# Patient Record
Sex: Male | Born: 1985 | Race: Black or African American | Hispanic: No | Marital: Single | Smoking: Current every day smoker
Health system: Southern US, Community
[De-identification: ages and names within clinical notes are randomized; demographics above are authoritative.]

## PROBLEM LIST (undated history)

## (undated) DIAGNOSIS — I10 Essential (primary) hypertension: Secondary | ICD-10-CM

---

## 2008-07-04 ENCOUNTER — Emergency Department: Payer: Self-pay | Admitting: Emergency Medicine

## 2020-05-06 ENCOUNTER — Emergency Department: Payer: Self-pay

## 2020-05-06 ENCOUNTER — Emergency Department
Admission: EM | Admit: 2020-05-06 | Discharge: 2020-05-06 | Disposition: A | Discharge status: Home / Self Care | Payer: Self-pay | Attending: Emergency Medicine | Admitting: Emergency Medicine

## 2020-05-06 ENCOUNTER — Other Ambulatory Visit: Payer: Self-pay

## 2020-05-06 DIAGNOSIS — F17200 Nicotine dependence, unspecified, uncomplicated: Secondary | ICD-10-CM | POA: Insufficient documentation

## 2020-05-06 DIAGNOSIS — A419 Sepsis, unspecified organism: Secondary | ICD-10-CM

## 2020-05-06 DIAGNOSIS — K122 Cellulitis and abscess of mouth: Secondary | ICD-10-CM

## 2020-05-06 LAB — COMPREHENSIVE METABOLIC PANEL
ALT: 25 U/L (ref 0–44)
AST: 36 U/L (ref 15–41)
Albumin: 4.6 g/dL (ref 3.5–5.0)
Alkaline Phosphatase: 56 U/L (ref 38–126)
Anion gap: 19 — ABNORMAL HIGH (ref 5–15)
BUN: 20 mg/dL (ref 6–20)
CO2: 16 mmol/L — ABNORMAL LOW (ref 22–32)
Calcium: 9.2 mg/dL (ref 8.9–10.3)
Chloride: 100 mmol/L (ref 98–111)
Creatinine, Ser: 1.02 mg/dL (ref 0.61–1.24)
GFR calc Af Amer: >60 mL/min (ref >60)
GFR calc non Af Amer: >60 mL/min (ref >60)
Glucose, Bld: 70 mg/dL (ref 70–99)
Potassium: 5.3 mmol/L — ABNORMAL HIGH (ref 3.5–5.1)
Sodium: 135 mmol/L (ref 135–145)
Total Bilirubin: 0.7 mg/dL (ref 0.3–1.2)
Total Protein: 8.4 g/dL — ABNORMAL HIGH (ref 6.5–8.1)

## 2020-05-06 LAB — CBC WITH DIFFERENTIAL/PLATELET
Abs Immature Granulocytes: 0.06 10*3/uL (ref 0.00–0.07)
Basophils Absolute: 0.1 10*3/uL (ref 0.0–0.1)
Basophils Relative: 1 %
Eosinophils Absolute: 0 10*3/uL (ref 0.0–0.5)
Eosinophils Relative: 0 %
HCT: 48.4 % (ref 39.0–52.0)
Hemoglobin: 17 g/dL (ref 13.0–17.0)
Immature Granulocytes: 0 %
Lymphocytes Relative: 14 %
Lymphs Abs: 2.3 10*3/uL (ref 0.7–4.0)
MCH: 32.9 pg (ref 26.0–34.0)
MCHC: 35.1 g/dL (ref 30.0–36.0)
MCV: 93.8 fL (ref 80.0–100.0)
Monocytes Absolute: 1.2 10*3/uL — ABNORMAL HIGH (ref 0.1–1.0)
Monocytes Relative: 8 %
Neutro Abs: 12.7 10*3/uL — ABNORMAL HIGH (ref 1.7–7.7)
Neutrophils Relative %: 77 %
Platelets: 398 10*3/uL (ref 150–400)
RBC: 5.16 MIL/uL (ref 4.22–5.81)
RDW: 12.9 % (ref 11.5–15.5)
WBC: 16.3 10*3/uL — ABNORMAL HIGH (ref 4.0–10.5)
nRBC: 0 % (ref 0.0–0.2)

## 2020-05-06 LAB — URINALYSIS, COMPLETE (UACMP) WITH MICROSCOPIC
Bacteria, UA: NONE SEEN
Bilirubin Urine: NEGATIVE
Glucose, UA: NEGATIVE mg/dL
Ketones, ur: 20 mg/dL — AB
Leukocytes,Ua: NEGATIVE
Nitrite: NEGATIVE
Protein, ur: 30 mg/dL — AB
Specific Gravity, Urine: 1.035 — ABNORMAL HIGH (ref 1.005–1.030)
Squamous Epithelial / HPF: NONE SEEN (ref 0–5)
pH: 5 (ref 5.0–8.0)

## 2020-05-06 LAB — LACTIC ACID, PLASMA
Lactic Acid, Venous: 7.4 mmol/L (ref 0.5–1.9)
Lactic Acid, Venous: 6.7 mmol/L (ref 0.5–1.9)

## 2020-05-06 LAB — PROTIME-INR
INR: 0.9 (ref 0.8–1.2)
Prothrombin Time: 12.1 seconds (ref 11.4–15.2)

## 2020-05-06 LAB — TROPONIN I (HIGH SENSITIVITY): Troponin I (High Sensitivity): 5 ng/L (ref <18)

## 2020-05-06 MED ORDER — SODIUM CHLORIDE 0.9 % IV SOLN: 1.0000 g | Freq: Once | INTRAVENOUS | Status: DC

## 2020-05-06 MED ORDER — VANCOMYCIN HCL 500 MG/100ML IV SOLN
500.0000 mg | Freq: Once | INTRAVENOUS | Status: AC
  Administered 2020-05-06: 500 mg via INTRAVENOUS
  Filled 2020-05-06: qty 100

## 2020-05-06 MED ORDER — ONDANSETRON HCL 4 MG/2ML IJ SOLN
4.0000 mg | Freq: Once | INTRAMUSCULAR | Status: AC
  Administered 2020-05-06: 4 mg via INTRAVENOUS
  Filled 2020-05-06: qty 2

## 2020-05-06 MED ORDER — MORPHINE SULFATE (PF) 4 MG/ML IV SOLN
4.0000 mg | Freq: Once | INTRAVENOUS | Status: AC
  Administered 2020-05-06: 4 mg via INTRAVENOUS
  Filled 2020-05-06: qty 1

## 2020-05-06 MED ORDER — SODIUM CHLORIDE 0.9 % IV BOLUS (SEPSIS)
250.0000 mL | Freq: Once | INTRAVENOUS | Status: AC
  Administered 2020-05-06: 250 mL via INTRAVENOUS

## 2020-05-06 MED ORDER — VANCOMYCIN HCL IN DEXTROSE 1-5 GM/200ML-% IV SOLN
1000.0000 mg | Freq: Once | INTRAVENOUS | Status: AC
  Administered 2020-05-06: 1000 mg via INTRAVENOUS
  Filled 2020-05-06: qty 200

## 2020-05-06 MED ORDER — SODIUM CHLORIDE 0.9 % IV BOLUS (SEPSIS)
1000.0000 mL | Freq: Once | INTRAVENOUS | Status: AC
  Administered 2020-05-06: 1000 mL via INTRAVENOUS

## 2020-05-06 MED ORDER — IOHEXOL 300 MG/ML  SOLN
75.0000 mL | Freq: Once | INTRAMUSCULAR | Status: AC | PRN
  Administered 2020-05-06: 75 mL via INTRAVENOUS
  Filled 2020-05-06: qty 75

## 2020-05-06 MED ORDER — SODIUM CHLORIDE 0.9 % IV BOLUS: 1000.0000 mL | Freq: Once | INTRAVENOUS | Status: DC

## 2020-05-06 MED ORDER — SODIUM CHLORIDE 0.9 % IV SOLN
2.0000 g | Freq: Once | INTRAVENOUS | Status: AC
  Administered 2020-05-06: 2 g via INTRAVENOUS
  Filled 2020-05-06 (×2): qty 20

## 2020-05-06 NOTE — Progress Notes (Signed)
PHARMACY -  BRIEF ANTIBIOTIC NOTE   Pharmacy has received consult(s) for vancomycin from an ED provider.  The patient's profile has been reviewed for ht/wt/allergies/indication/available labs.    One time order(s) placed for vancomycin 1 g IV x1 by EDP. Will order another 500 mg IV x1 for total loading dose of 1500 mg IV x1.   Further antibiotics/pharmacy consults should be ordered by admitting physician if indicated.                       Thank you, Marty Heck 05/06/2020  4:33 PM

## 2020-05-06 NOTE — ED Triage Notes (Signed)
Reports abscess to roof of mouth X 2 days. Pt alert and oriented X4, cooperative, RR even and unlabored, color WNL. Pt in NAD.

## 2020-05-06 NOTE — ED Notes (Signed)
Waiting on antibiotics from pharmacy - antibiotics not in pixis - pharmacy notified.

## 2020-05-06 NOTE — ED Notes (Signed)
Pt with swollen red area roof of mouth. Pain 10/10

## 2020-05-06 NOTE — ED Provider Notes (Addendum)
[facility] Emergency Department Provider Note  ____________________________________________   First MD Initiated Contact with Patient 05/06/20 1402     (approximate)  I have reviewed the triage vital signs and the nursing notes.   HISTORY  Chief Complaint Abscess    HPI Zachary Pierce is a 34 y.o. male presents emergency department complaining of abscess to the roof of his mouth since Monday.  Pain is 10 out of 10.  States he feels weak and sick due to the abscess.  Some headache.  Patient states that he had his teeth knocked out from accident when he was younger but the old teeth are still there where the implants underneath the replacement teeth.  He denies fever but states he has felt hot and cold.    History reviewed. No pertinent past medical history.  There are no problems to display for this patient.   History reviewed. No pertinent surgical history.  Prior to Admission medications   Not on File    Allergies Patient has no known allergies.  No family history on file.  Social History Social History   Tobacco Use  . Smoking status: Current Every Day Smoker  Substance Use Topics  . Alcohol use: Not Currently  . Drug use: Not on file    Review of Systems  Constitutional: No fever/chills Eyes: No visual changes. ENT: No sore throat.  Positive for abscess or growth in the roof of the mouth Respiratory: Denies cough Cardiovascular: Denies chest pain Gastrointestinal: Denies abdominal pain Genitourinary: Negative for dysuria. Musculoskeletal: Negative for back pain. Skin: Negative for rash. Psychiatric: no mood changes,     ____________________________________________   PHYSICAL EXAM:  VITAL SIGNS: ED Triage Vitals [05/06/20 1300]  Enc Vitals Group     BP (!) 157/78     Pulse Rate 100     Resp 16     Temp 98.9 F (37.2 C)     Temp Source Oral     SpO2 97 %     Weight 153 lb (69.4 kg)     Height 5\' 9"  (1.753 m)       Head Circumference      Peak Flow      Pain Score 10     Pain Loc      Pain Edu?      Excl. in [place]?     Constitutional: Alert and oriented. Well appearing and in no acute distress. Eyes: Conjunctivae are normal.  Head: Atraumatic. Nose: No congestion/rhinnorhea. Mouth/Throat: Mucous membranes are moist.  Positive for marble sized skin//colored mass in the roof of the mouth Neck:  supple no lymphadenopathy noted Cardiovascular: Normal rate, regular rhythm. Heart sounds are normal Respiratory: Normal respiratory effort.  No retractions, lungs c t a  GU: deferred Musculoskeletal: FROM all extremities, warm and well perfused Neurologic:  Normal speech and language.  Skin:  Skin is warm, dry and intact. No rash noted. Psychiatric: Mood and affect are normal. Speech and behavior are normal.  ____________________________________________   LABS (all labs ordered are listed, but only abnormal results are displayed)  Labs Reviewed  CBC WITH DIFFERENTIAL/PLATELET - Abnormal; Notable for the following components:      Result Value   WBC 16.3 (*)    Neutro Abs 12.7 (*)    Monocytes Absolute 1.2 (*)    All other components within normal limits  COMPREHENSIVE METABOLIC PANEL - Abnormal; Notable for the following components:   Potassium 5.3 (*)    CO2 16 (*)  Total Protein 8.4 (*)    Anion gap 19 (*)    All other components within normal limits  LACTIC ACID, PLASMA - Abnormal; Notable for the following components:   Lactic Acid, Venous 7.4 (*)    All other components within normal limits  LACTIC ACID, PLASMA - Abnormal; Notable for the following components:   Lactic Acid, Venous 6.7 (*)    All other components within normal limits  URINALYSIS, COMPLETE (UACMP) WITH MICROSCOPIC - Abnormal; Notable for the following components:   Color, Urine STRAW (*)    APPearance CLEAR (*)    Specific Gravity, Urine 1.035 (*)    Hgb urine dipstick MODERATE (*)    Ketones, ur 20 (*)     Protein, ur 30 (*)    All other components within normal limits  CULTURE, BLOOD (ROUTINE X 2)  CULTURE, BLOOD (ROUTINE X 2)  URINE CULTURE  PROTIME-INR  TROPONIN I (HIGH SENSITIVITY)   ____________________________________________   ____________________________________________  RADIOLOGY  CT maxillofacial with IV contrast shows a large cyst at the hard palate  ____________________________________________   PROCEDURES  Procedure(s) performed: EKG shows normal sinus rhythm   Procedures   CRITICAL CARE Performed by: Faythe Ghee   Total critical care time: 45 minutes  Critical care time was exclusive of separately billable procedures and treating other patients.  Critical care was necessary to treat or prevent imminent or life-threatening deterioration.  Multiple conversations with the patient.  Multiple antibiotics ordered.  Several visits into the room to assess his status.  Patient still left AMA  Critical care was time spent personally by me on the following activities: development of treatment plan with patient and/or surrogate as well as nursing, discussions with consultants, evaluation of patient's response to treatment, examination of patient, obtaining history from patient or surrogate, ordering and performing treatments and interventions, ordering and review of laboratory studies, ordering and review of radiographic studies, pulse oximetry and re-evaluation of patient's condition.  ____________________________________________   INITIAL IMPRESSION / ASSESSMENT AND PLAN / ED COURSE  Pertinent labs & imaging results that were available during my care of the patient were reviewed by me and considered in my medical decision making (see chart for details).   Patient is a 34 year old male presents emergency department with concerns of an abscess in the roof of the mouth.  See HPI physical exam does show that there is a marble sized lesion on the roof of the  mouth.  DDx: Dental abscess, tumor, sinus abscess  CBC, metabolic panel, lactic acid  CT maxillofacial with IV contrast  ----------------------------------------- 3:41 PM on 05/06/2020 -----------------------------------------  Patient's WBC is elevated at 16.5, now his lactic acid is showing he may be septic as it is elevated at 7.4.  Patient did not originally look septic upon arrival.  We will now be considering him septic.  Code sepsis was initiated by Dr. Fuller Plan    CBC is elevated WBC of 16.3 with elevated neutrophils of 13.7, comprehensive metabolic panel has a increased potassium of 5.3, INR is normal, lactic acid was 7.3 on the first draw, 2 hours later was 6.7  Long discussion with the patient about being admitted for sepsis.  Patient is still refusing and will sign out AMA.  Dr. Fuller Plan in to see the patient and also explained to him that he needs to be admitted.  Patient is still refusing.  Had his fiance called the patient's mother for her to talk to him and he is still refusing to stay.  I did speak with Dr. Jenne Campus about the cyst.  He states this is more of a dental abscess that needs to be seen and treated by oral surgeon.  States they do have an Transport planner on-call at [facility].  EKG did show normal sinus rhythm.  Troponin to evaluate heart function in case there could be a chance of endocarditis, pericarditis or myocarditis.  ----------------------------------------- 7:08 PM on 05/06/2020 -----------------------------------------  EKG and troponin are reassuring.  They are both normal.  I did explain the findings to the patient.  He is still refusing admission.  Explained to him he will sign out AMA which means he does not get antibiotics to go home with or pain medication.  He states he understands.  I did go through the discussion of sepsis and kill you.  I explained to him that he may get so sick that we are not able to save him.  His fiance is very upset with him for  not staying.  But he is still refusing admission.  Nurses will sign him out AMA.   Zachary Pierce was evaluated in Emergency Department on 05/06/2020 for the symptoms described in the history of present illness. He was evaluated in the context of the global COVID-19 pandemic, which necessitated consideration that the patient might be at risk for infection with the SARS-CoV-2 virus that causes COVID-19. Institutional protocols and algorithms that pertain to the evaluation of patients at risk for COVID-19 are in a state of rapid change based on information released by regulatory bodies including the CDC and federal and state organizations. These policies and algorithms were followed during the patient's care in the ED.   As part of my medical decision making, I reviewed the following data within the electronic MEDICAL RECORD NUMBER Nursing notes reviewed and incorporated, Labs reviewed , EKG interpreted NSR, Old EKG reviewed, Radiograph reviewed , Evaluated by EM attending dr Fuller Plan, consult from ENT, dr Jenne Campus, Notes from prior ED visits and [place] Controlled Substance Database  ____________________________________________   FINAL CLINICAL IMPRESSION(S) / ED DIAGNOSES  Final diagnoses:  Acute sepsis (HCC)  Abscess of palate      NEW MEDICATIONS STARTED DURING THIS VISIT:  New Prescriptions   No medications on file     Note:  This document was prepared using Dragon voice recognition software and may include unintentional dictation errors.    Faythe Ghee, PA-C 05/06/20 1910    Sherrie Mustache Roselyn Bering, PA-C 05/09/20 1044    Concha Se, MD 05/11/20 [phone]

## 2020-05-06 NOTE — ED Notes (Signed)
Pt stating he wants to leave. This RN , Social worker and [place], [place] encouraged pt to stay to be admitted and treat infection. Pt agreeing to receive antibiotics and fluids but states will leave once finished. Pt made aware by nurses and PA the dangers of leaving AMA. Pt verbalizes understanding.

## 2020-05-06 NOTE — Progress Notes (Signed)
CODE SEPSIS - PHARMACY COMMUNICATION  **Broad Spectrum Antibiotics should be administered within 1 hour of Sepsis diagnosis**  Time Code Sepsis Called/Page Received: 1546  Antibiotics Ordered: ceftriaxone and vancomycin   Time of 1st antibiotic administration: 1658 - ceftriaxone   Additional action taken by pharmacy: Called RN at 1635 - delay in abx as pt may not stay to be treated  If necessary, Name of Provider/Nurse Contacted:     Marty Heck ,PharmD Clinical Pharmacist  05/06/2020  4:34 PM

## 2020-05-06 NOTE — ED Notes (Signed)
Pt in CT.

## 2020-05-06 NOTE — ED Notes (Signed)
Date and time results received: 05/06/20 3:40 PM  (use smartphrase ".now" to insert current time)  Test: Lactic  Critical Value: 7.4  Name of Provider Notified: Darl Pikes, [place] and Dr. Fuller Plan  Orders Received? Or Actions Taken?: No new orders at this time

## 2020-05-08 LAB — URINE CULTURE: Culture: NO GROWTH

## 2020-05-11 LAB — CULTURE, BLOOD (ROUTINE X 2)
Culture: NO GROWTH
Culture: NO GROWTH
Special Requests: ADEQUATE

## 2022-01-16 ENCOUNTER — Ambulatory Visit: Admission: EM | Admit: 2022-01-16 | Discharge: 2022-01-16 | Payer: Self-pay

## 2022-01-16 ENCOUNTER — Emergency Department: Payer: Self-pay

## 2022-01-16 ENCOUNTER — Other Ambulatory Visit: Payer: Self-pay

## 2022-01-16 ENCOUNTER — Emergency Department
Admission: EM | Admit: 2022-01-16 | Discharge: 2022-01-16 | Disposition: A | Discharge status: Nursing Home (Includes ICF) | Payer: Self-pay | Attending: Emergency Medicine | Admitting: Emergency Medicine

## 2022-01-16 DIAGNOSIS — R579 Shock, unspecified: Secondary | ICD-10-CM

## 2022-01-16 DIAGNOSIS — A419 Sepsis, unspecified organism: Secondary | ICD-10-CM | POA: Insufficient documentation

## 2022-01-16 DIAGNOSIS — M726 Necrotizing fasciitis: Secondary | ICD-10-CM | POA: Insufficient documentation

## 2022-01-16 DIAGNOSIS — Z20822 Contact with and (suspected) exposure to covid-19: Secondary | ICD-10-CM | POA: Insufficient documentation

## 2022-01-16 DIAGNOSIS — N179 Acute kidney failure, unspecified: Secondary | ICD-10-CM | POA: Insufficient documentation

## 2022-01-16 DIAGNOSIS — R6521 Severe sepsis with septic shock: Secondary | ICD-10-CM | POA: Insufficient documentation

## 2022-01-16 LAB — RESP PANEL BY RT-PCR (FLU A&B, COVID) ARPGX2
Influenza A by PCR: NEGATIVE
Influenza B by PCR: NEGATIVE
SARS Coronavirus 2 by RT PCR: NEGATIVE

## 2022-01-16 LAB — CBC
HCT: 51.8 % (ref 39.0–52.0)
Hemoglobin: 17.4 g/dL — ABNORMAL HIGH (ref 13.0–17.0)
MCH: 32.8 pg (ref 26.0–34.0)
MCHC: 33.6 g/dL (ref 30.0–36.0)
MCV: 97.7 fL (ref 80.0–100.0)
Platelets: 278 10*3/uL (ref 150–400)
RBC: 5.3 MIL/uL (ref 4.22–5.81)
RDW: 12.7 % (ref 11.5–15.5)
WBC: 27.3 10*3/uL — ABNORMAL HIGH (ref 4.0–10.5)
nRBC: 0 % (ref 0.0–0.2)

## 2022-01-16 LAB — BASIC METABOLIC PANEL
Anion gap: 17 — ABNORMAL HIGH (ref 5–15)
BUN: 57 mg/dL — ABNORMAL HIGH (ref 6–20)
CO2: 16 mmol/L — ABNORMAL LOW (ref 22–32)
Calcium: 7.2 mg/dL — ABNORMAL LOW (ref 8.9–10.3)
Chloride: 101 mmol/L (ref 98–111)
Creatinine, Ser: 3.58 mg/dL — ABNORMAL HIGH (ref 0.61–1.24)
GFR, Estimated: 22 mL/min — ABNORMAL LOW (ref >60)
Glucose, Bld: 132 mg/dL — ABNORMAL HIGH (ref 70–99)
Potassium: 4.7 mmol/L (ref 3.5–5.1)
Sodium: 134 mmol/L — ABNORMAL LOW (ref 135–145)

## 2022-01-16 LAB — APTT: aPTT: 38 seconds — ABNORMAL HIGH (ref 24–36)

## 2022-01-16 LAB — LACTIC ACID, PLASMA
Lactic Acid, Venous: 3.9 mmol/L (ref 0.5–1.9)
Lactic Acid, Venous: 3.8 mmol/L (ref 0.5–1.9)

## 2022-01-16 LAB — PROTIME-INR
INR: 2 — ABNORMAL HIGH (ref 0.8–1.2)
Prothrombin Time: 22.5 seconds — ABNORMAL HIGH (ref 11.4–15.2)

## 2022-01-16 LAB — MAGNESIUM: Magnesium: 1.7 mg/dL (ref 1.7–2.4)

## 2022-01-16 MED ORDER — CLINDAMYCIN PHOSPHATE 900 MG/50ML IV SOLN
900.0000 mg | Freq: Once | INTRAVENOUS | Status: AC
  Administered 2022-01-16: 900 mg via INTRAVENOUS
  Filled 2022-01-16: qty 50

## 2022-01-16 MED ORDER — MORPHINE SULFATE (PF) 4 MG/ML IV SOLN
6.0000 mg | Freq: Once | INTRAVENOUS | Status: AC
Start: 2022-01-16 — End: 2022-01-16
  Administered 2022-01-16: 6 mg via INTRAVENOUS
  Filled 2022-01-16: qty 2

## 2022-01-16 MED ORDER — VANCOMYCIN HCL 1750 MG/350ML IV SOLN
1750.0000 mg | Freq: Once | INTRAVENOUS | Status: AC
  Administered 2022-01-16: 1750 mg via INTRAVENOUS
  Filled 2022-01-16 (×2): qty 350

## 2022-01-16 MED ORDER — LACTATED RINGERS IV BOLUS
30.0000 mL/kg | Freq: Once | INTRAVENOUS | Status: AC
Start: 2022-01-16 — End: 2022-01-16
  Administered 2022-01-16: 2124 mL via INTRAVENOUS

## 2022-01-16 MED ORDER — LACTATED RINGERS IV SOLN: INTRAVENOUS | Status: DC

## 2022-01-16 MED ORDER — PIPERACILLIN-TAZOBACTAM 3.375 G IVPB 30 MIN
3.3750 g | Freq: Once | INTRAVENOUS | Status: AC
Start: 2022-01-16 — End: 2022-01-16
  Administered 2022-01-16: 3.375 g via INTRAVENOUS
  Filled 2022-01-16: qty 50

## 2022-01-16 MED ORDER — CALCIUM GLUCONATE-NACL 1-0.675 GM/50ML-% IV SOLN
1.0000 g | Freq: Once | INTRAVENOUS | Status: AC
Start: 2022-01-16 — End: 2022-01-16
  Administered 2022-01-16: 1000 mg via INTRAVENOUS
  Filled 2022-01-16: qty 50

## 2022-01-16 NOTE — ED Notes (Addendum)
Called [facility] transfer  Tresa Endo) for ED to ED transfer for Fuller Plan, MD

## 2022-01-16 NOTE — Consult Note (Signed)
PHARMACY -  BRIEF ANTIBIOTIC NOTE   Pharmacy has received consult(s) for cellulitis from an ED provider.  The patient's profile has been reviewed for ht/wt/allergies/indication/available labs.    One time order(s) placed for vancomycin   Further antibiotics/pharmacy consults should be ordered by admitting physician if indicated.                       Thank you, Ronnald Ramp 01/16/2022  7:55 PM

## 2022-01-16 NOTE — ED Notes (Signed)
Patient is being discharged from the Urgent Care and sent to the Emergency Department via EMS . Per White, NP, patient is in need of higher level of care due to difficulty breathing. Patient is aware and verbalizes understanding of plan of care.  Vitals:   01/16/22 1719  BP: (!) 49/27  Pulse: (!) 134  Resp: 16  SpO2: 100%

## 2022-01-16 NOTE — Sepsis Progress Note (Signed)
Elink following Code Sepsis. 

## 2022-01-16 NOTE — ED Triage Notes (Signed)
Pt to ED from UC for right sided facial swelling. Stated cracked left lower tooth 2 days ago. Noticed swelling today. Significant swelling noted. Pt unable to open left eye. Unable to open mouth all the way.

## 2022-01-16 NOTE — ED Notes (Signed)
Patient transported to CT 

## 2022-01-16 NOTE — ED Notes (Signed)
ED Provider at bedside. 

## 2022-01-16 NOTE — ED Provider Notes (Signed)
[facility] Provider Note    Event Date/Time   First MD Initiated Contact with Patient 01/16/22 1940     (approximate)   History   Facial Swelling   HPI  Zachary Pierce is a 36 y.o. male without significant past medical history who presents for assessment of 4 days of worsening pain redness and swelling on the left side of his face that he believes is emanating from the cracked left inferior posterior molar.  He states he cracked it when his symptoms began while eating some chicken.  He denies any other trauma.  He has been spitting some blood and pus since then.  He denies any chest pain, cough, shortness of breath, back pain, abdominal pain, diarrhea, constipation, urinary symptoms rash or extremity pain.  He endorses some THC use but no other illicit drug use, tobacco abuse or EtOH use.  States that something similar happened years ago when he had an infected tooth on the other side but it was not this bad.  He was in urgent care earlier today who referred immediately to emergency room.      Physical Exam  Triage Vital Signs: ED Triage Vitals  Enc Vitals Group     BP 01/16/22 1849 (!) 144/96     Pulse Rate 01/16/22 1858 (!) 125     Resp 01/16/22 1849 20     Temp 01/16/22 1849 98.9 F (37.2 C)     Temp Source 01/16/22 1849 Oral     SpO2 01/16/22 1858 100 %     Weight 01/16/22 1852 156 lb (70.8 kg)     Height 01/16/22 1852 5\' 9"  (1.753 m)     Head Circumference --      Peak Flow --      Pain Score 01/16/22 1852 8     Pain Loc --      Pain Edu? --      Excl. in [place]? --     Most recent vital signs: Vitals:   01/16/22 1858 01/16/22 1940  BP:  (!) 163/127  Pulse: (!) 125 (!) 116  Resp:  20  Temp:    SpO2: 100% 96%    General: Awake, ill-appearing CV:  Tachycardic.  Prolonged capillary refill.  No murmurs rubs or gallops Resp:  Normal effort.  Clear bilaterally Abd:  No distention.  Soft Other:  Diffuse left-sided facial swelling  including periorbital edema and over the left maxilla and left jaw.  Somewhat difficult to clearly visualize patient's left posterior inferior molar and gingiva although there is area of edema and some purulent drainage in this area.  He is actively spitting up some purulent saliva mixed with some blood.  There is no stridor over his neck and right face and right are grossly unremarkable.   ED Results / Procedures / Treatments  Labs (all labs ordered are listed, but only abnormal results are displayed) Labs Reviewed  CBC - Abnormal; Notable for the following components:      Result Value   WBC 27.3 (*)    Hemoglobin 17.4 (*)    All other components within normal limits  BASIC METABOLIC PANEL - Abnormal; Notable for the following components:   Sodium 134 (*)    CO2 16 (*)    Glucose, Bld 132 (*)    BUN 57 (*)    Creatinine, Ser 3.58 (*)    Calcium 7.2 (*)    GFR, Estimated 22 (*)    Anion gap 17 (*)  All other components within normal limits  LACTIC ACID, PLASMA - Abnormal; Notable for the following components:   Lactic Acid, Venous 3.9 (*)    All other components within normal limits  CULTURE, BLOOD (ROUTINE X 2)  CULTURE, BLOOD (ROUTINE X 2)  RESP PANEL BY RT-PCR (FLU A&B, COVID) ARPGX2  LACTIC ACID, PLASMA  PROTIME-INR  APTT  MAGNESIUM     EKG    RADIOLOGY  CT face ordered interpreted by myself shows extensive soft tissue swelling and gas in the left maxilla area tracking towards the left orbit apparently into the orbital space.  Also reviewed radiology interpretation and agree with their findings.  PROCEDURES:  Critical Care performed: Yes, see critical care procedure note(s)  .Critical Care Performed by: Lucrezia Starch, MD Authorized by: Lucrezia Starch, MD   Critical care provider statement:    Critical care time (minutes):  30   Critical care was necessary to treat or prevent imminent or life-threatening deterioration of the following conditions:   Sepsis   Critical care was time spent personally by me on the following activities:  Development of treatment plan with patient or surrogate, discussions with consultants, evaluation of patient's response to treatment, examination of patient, ordering and review of laboratory studies, ordering and review of radiographic studies, ordering and performing treatments and interventions, pulse oximetry, re-evaluation of patient's condition and review of old charts  The patient is on the cardiac monitor to evaluate for evidence of arrhythmia and/or significant heart rate changes.   MEDICATIONS ORDERED IN ED: Medications  vancomycin (VANCOREADY) IVPB 1750 mg/350 mL (has no administration in time range)  lactated ringers infusion (has no administration in time range)  calcium gluconate 1 g/ 50 mL sodium chloride IVPB (has no administration in time range)  lactated ringers bolus 2,124 mL (2,124 mLs Intravenous New Bag/Given 01/16/22 2033)  piperacillin-tazobactam (ZOSYN) IVPB 3.375 g (3.375 g Intravenous New Bag/Given 01/16/22 2031)  morphine 4 MG/ML injection 6 mg (6 mg Intravenous Given 01/16/22 2034)  clindamycin (CLEOCIN) IVPB 900 mg (900 mg Intravenous New Bag/Given 01/16/22 2049)     IMPRESSION / MDM / ASSESSMENT AND PLAN / ED COURSE  I reviewed the triage vital signs and the nursing notes.                              Differential diagnosis includes, but is not limited to large facial abscess possibly odontogenic in origin causing sepsis and metabolic derangements.  I think this is likely the source of patient's purulent and bloody saliva.  He reports cracking a tooth and so it could also have been traumatic initially.  He has no neck stiffness or other pain that is not localized to the left side of his face and I do not believe he is meningitic.  He is currently protecting his airway.  He is tachycardic and hypertensive on arrival but otherwise has no evidence of hypoxia or respiratory  distress.   CT face ordered interpreted by myself shows extensive soft tissue swelling and gas in the left maxilla area tracking towards the left orbit apparently into the orbital space.  Also reviewed radiology interpretation and agree with their findings.  CBC is remarkable for WBC count of 27.3 hemoglobin of 17.4.  BMP is remarkable for bicarb of 16, BUN 57, creatinine of 3.58 with anion gap of 17.  Initial lactic acid of 6.7.  This down trended to 3.9 after some IV fluids.  Full sepsis work-up initiated including blood cultures and broad-spectrum antibiotics started.  Given concern for necrotizing infection requiring emergent multispecialty intervention Dr. Nickolas Madrid my colleague in the emergency room assist in reaching out to [facility] for emergent ED to ED transfer.  Patient accepted by Dr. Victorio Palm.       FINAL CLINICAL IMPRESSION(S) / ED DIAGNOSES   Final diagnoses:  Necrotizing fasciitis ([place])  AKI (acute kidney injury) ([place])  Sepsis, due to unspecified organism, unspecified whether acute organ dysfunction present ([place])  Shock ([place])  Hypocalcemia     Rx / DC Orders   ED Discharge Orders     None        Note:  This document was prepared using Dragon voice recognition software and may include unintentional dictation errors.   Lucrezia Starch, MD 01/16/22 2124

## 2022-01-16 NOTE — ED Notes (Signed)
Med nescessity completed and given to [facility]

## 2022-01-16 NOTE — ED Triage Notes (Signed)
Patient presents to Urgent Care with complaints of left lower dental abscess. Mom states facial swelling increased today. Pt states earlier he was having difficulty swallowing.

## 2022-01-16 NOTE — ED Notes (Signed)
Provider at bedside

## 2022-01-16 NOTE — ED Notes (Signed)
This RN spoke to Proofreader from [place]. Report given to pt. All questions answered

## 2022-01-16 NOTE — ED Notes (Signed)
Vancomycin ordered from pharamcy

## 2022-01-16 NOTE — ED Provider Notes (Addendum)
8:50 PM Assumed care for off going team.   Blood pressure (!) 163/127, pulse (!) 116, temperature 98.9 F (37.2 C), temperature source Oral, resp. rate 20, height 5\' 9"  (1.753 m), weight 70.8 kg, SpO2 96 %.  See their HPI for full report but in brief I received the report that patient had a CT scan concerning for necrotizing fasciitis extending into the middle cranial fossa.  Due to the extent of this needs to be transferred.  Discussed with family about options and they are okay with going to do.  Patient is a new renal failure with tachycardia and significantly elevated white count.  I did discuss the case with ENT who recommends transfer to main hospital  I discussed with the [facility] team.  I added on clindamycin.  I explained to family my concerns for significant infection in his face  Pt accepted Dr. to [facility]   .Critical Care Performed by: [facility], MD Authorized by: Concha Se, MD   Critical care provider statement:    Critical care time (minutes):  30   Critical care was time spent personally by me on the following activities:  Development of treatment plan with patient or surrogate, discussions with consultants, evaluation of patient's response to treatment, examination of patient, ordering and review of laboratory studies, ordering and review of radiographic studies, ordering and performing treatments and interventions, pulse oximetry, re-evaluation of patient's condition and review of old charts          Concha Se, MD 01/16/22 2122

## 2022-01-16 NOTE — ED Notes (Addendum)
[facility] at bedside to transfer pt to [facility]. Report given to [employer] Pt stable at this time. Morrie Sheldon RN from [place] called and notified that pt will be on his way shortly

## 2022-01-16 NOTE — ED Notes (Signed)
First nurse-pt brought in via ems from [facility] urgent care with swelling to left side of face and abscess tooth.2 iv's in place.  Bp186/123, pulse107, resp 26, temp 98.4 orally per ems.  Pt in wheelchair in lobby  pt alert  iv fluids infusing.  Family with pt.

## 2022-01-16 NOTE — Sepsis Progress Note (Signed)
Notified bedside nurse of need to draw repeat lactic acid. 

## 2022-01-16 NOTE — Consult Note (Signed)
CODE SEPSIS - PHARMACY COMMUNICATION  **Broad Spectrum Antibiotics should be administered within 1 hour of Sepsis diagnosis**  Time Code Sepsis Called/Page Received: 1957  Antibiotics Ordered: 2000  Time of 1st antibiotic administration: 2031  Additional action taken by pharmacy: N/A  If necessary, Name of Provider/Nurse Contacted: N/A  Martyn Malay ,PharmD Clinical Pharmacist  01/16/2022  9:24 PM

## 2022-01-21 LAB — CULTURE, BLOOD (ROUTINE X 2): Culture: NO GROWTH

## 2023-01-02 ENCOUNTER — Encounter: Payer: Self-pay | Admitting: Emergency Medicine

## 2023-01-02 ENCOUNTER — Ambulatory Visit: Admission: EM | Admit: 2023-01-02 | Discharge: 2023-01-02 | Payer: Self-pay

## 2023-01-02 DIAGNOSIS — I10 Essential (primary) hypertension: Secondary | ICD-10-CM

## 2023-01-02 HISTORY — DX: Essential (primary) hypertension: I10

## 2023-01-02 NOTE — Discharge Instructions (Signed)
As we discussed, your elevated blood pressure, fast heart rate, and EKG changes are definitely concerning.  Additionally, you have pain, redness, and swelling in your mouth with a history of necrotizing fasciitis.  For these reasons I feel you would better be served to be evaluated in the emergency department.  Please go to [facility] for further evaluation.  Please go now.

## 2023-01-02 NOTE — ED Triage Notes (Addendum)
Pt states he woke up with chills and took his BP it was 213/177 on his wrist. He took it again 2 hours later and it was 203/120's. Pt is on BP medication but has not taken it for 3 months due to being out of the medication. His last PCP visit was in October 2023 at [facility].   Pt also reports his BP was high last week when he went to the dentist to get his bridge fixed.

## 2023-01-02 NOTE — ED Provider Notes (Signed)
[facility]    CSN: [id number] Arrival date & time: 01/02/23  1104      History   Chief Complaint Chief Complaint  Patient presents with   Hypertension    HPI Zachary Pierce is a 37 y.o. male.   HPI  37 year old male here for evaluation of elevated blood pressure.  The patient has a history of hypertension and is prescribed amlodipine 10 mg daily and Coreg 25 mg daily but states that he has not taken them in the last 3 months because he could not afford them.  He is followed by [facility] and goes to the [facility] in [place].  He, he has not been taking the 5 mg Eliquis that he has been prescribed for the last 3 months either.  He was prescribed this by [facility] a year ago after having necrotizing fasciitis and multiple incisions and drainage of sinus and oropharyngeal infection.  He states that he went to his dentist last week to have his bridge repaired and he had elevated blood pressure at that time as well.  He is complaining of cold chills, shortness of breath, pain and swelling in his mouth.  Past Medical History:  Diagnosis Date   Hypertension     There are no problems to display for this patient.   History reviewed. No pertinent surgical history.     Home Medications    Prior to Admission medications   Medication Sig Start Date End Date Taking? Authorizing Provider  amLODipine (NORVASC) 10 MG tablet Take by mouth. 02/23/22  Yes [provider]  apixaban (ELIQUIS) 5 MG TABS tablet Take by mouth. 02/22/22  Yes [provider]  carvedilol (COREG) 25 MG tablet Take by mouth. 02/22/22  Yes [provider]    Family History History reviewed. No pertinent family history.  Social History Social History   Tobacco Use   Smoking status: Every Day    Types: Cigars   Smokeless tobacco: Never  Vaping Use   Vaping Use: Never used  Substance Use Topics   Alcohol use: Yes   Drug use: Yes    Types:  Marijuana     Allergies   Patient has no known allergies.   Review of Systems Review of Systems  Constitutional:  Positive for chills. Negative for diaphoresis and fever.  HENT:  Positive for dental problem and mouth sores.        Multiple broken front teeth with pain and swelling of the surrounding gum tissue.  Respiratory:  Positive for shortness of breath.   Cardiovascular:  Negative for chest pain.  Neurological:  Negative for dizziness and headaches.  Hematological: Negative.   Psychiatric/Behavioral: Negative.       Physical Exam Triage Vital Signs ED Triage Vitals  Enc Vitals Group     BP      Pulse      Resp      Temp      Temp src      SpO2      Weight      Height      Head Circumference      Peak Flow      Pain Score      Pain Loc      Pain Edu?      Excl. in [place]?    No data found.  Updated Vital Signs BP (!) 178/109 (BP Location: Left Arm)   Pulse (!) 102   Temp 98.4 F (36.9  C) (Oral)   Resp 18   SpO2 99%   Visual Acuity Right Eye Distance:   Left Eye Distance:   Bilateral Distance:    Right Eye Near:   Left Eye Near:    Bilateral Near:     Physical Exam Vitals and nursing note reviewed.  Constitutional:      Appearance: Normal appearance. He is not ill-appearing.  HENT:     Head: Normocephalic and atraumatic.     Mouth/Throat:     Mouth: Mucous membranes are moist.     Pharynx: Oropharynx is clear. Posterior oropharyngeal erythema present. No oropharyngeal exudate.     Comments: Patient has missing front incisors.  The left front incisor is broken off at the gumline and the surrounding tissue is erythematous and edematous.  No appreciable discharge. Cardiovascular:     Rate and Rhythm: Regular rhythm. Tachycardia present.     Pulses: Normal pulses.     Heart sounds: Normal heart sounds. No murmur heard.    No friction rub. No gallop.  Pulmonary:     Effort: Pulmonary effort is normal.     Breath sounds: Normal breath sounds. No  wheezing, rhonchi or rales.  Skin:    General: Skin is warm.     Capillary Refill: Capillary refill takes less than 2 seconds.  Neurological:     General: No focal deficit present.     Mental Status: He is alert and oriented to person, place, and time.  Psychiatric:        Mood and Affect: Mood normal.        Behavior: Behavior normal.        Thought Content: Thought content normal.        Judgment: Judgment normal.      UC Treatments / Results  Labs (all labs ordered are listed, but only abnormal results are displayed) Labs Reviewed - No data to display  EKG Sinus tachycardia with ventricular rate of 106 bpm PR interval 122 ms QRS duration 94 ms QT/QTc 350/464 ms P waves indicating left atrial enlargement as well as Q waves in V4 and V5.  No other tracings available for comparison in epic.  Radiology No results found.  Procedures Procedures (including critical care time)  Medications Ordered in UC Medications - No data to display  Initial Impression / Assessment and Plan / UC Course  I have reviewed the triage vital signs and the nursing notes.  Pertinent labs & imaging results that were available during my care of the patient were reviewed by me and considered in my medical decision making (see chart for details).   Patient is a 37 year old male with a history of hypertension and also history of necrotizing fasciitis that required multiple incision and drainage operations by [facility] back in January 2023.  He was discharged home on outpatient antimicrobial therapy and was followed by [facility] for period of time.  His last appointment with [facility] was in March 2023 with no other further visits available in epic.  He had been prescribed Eliquis 5 mg daily by the clinic but states that he ran out of it 3 months ago and has not contacted them for refill.  He is also has been taking medication for his blood pressure but he did not pick up his new prescription  because of cost issues.  He is followed by [facility] but he did not talk to his primary care provider about pharmacy assistance through [facility] he does  stop taking the medication.  Last night he developed shortness of breath and cold chills as well as tenderness and swelling around his broken front teeth so he came in for evaluation.  An EKG was ordered and it shows sinus tachycardia with also possible left atrial enlargement as there is biphasic P waves on his tracing.  He also has Q waves present in V4 and V5.  Given his EKG abnormality, his tachycardia, his elevated blood pressure, and his history of necrotizing fasciitis in his mouth I have recommended the patient that he go to the emergency department for further evaluation and management.  I did recommend to [facility] as they have a dental school and since he is followed by [facility] which is part of [facility].  I believe he is stable to go by car.  His girlfriend will drive him to [facility] for further evaluation.   Final Clinical Impressions(s) / UC Diagnoses   Final diagnoses:  Hypertension, unspecified type     Discharge Instructions      As we discussed, your elevated blood pressure, fast heart rate, and EKG changes are definitely concerning.  Additionally, you have pain, redness, and swelling in your mouth with a history of necrotizing fasciitis.  For these reasons I feel you would better be served to be evaluated in the emergency department.  Please go to [facility] for further evaluation.  Please go now.     ED Prescriptions   None    PDMP not reviewed this encounter.   Margarette Canada, NP 01/02/23 1154

## 2023-01-02 NOTE — ED Notes (Signed)
Patient is being discharged from the Urgent Care and sent to the Emergency Department via personal vehicle . Per Margarette Canada, NP, patient is in need of higher level of care due to abnormal EKG and hypertension. Patient is aware and verbalizes understanding of plan of care.  Vitals:   01/02/23 1120  BP: (!) 178/109  Pulse: (!) 102  Resp: 18  Temp: 98.4 F (36.9 C)  SpO2: 99%

## 2023-05-09 IMAGING — CT CT HEAD W/O CM
4 series · 15 of 47 positions shown, 17 images · non-contrast
Comparison: 01/16/2022

CLINICAL DATA: Left lower tooth injury 2 days ago, left-sided
facial swelling, findings consistent with necrotizing fasciitis on
previous facial CT



[Series 2: head wo · axial · 0.52mm/px · z∈[-96,+24]mm · 7 of 32 slices shown, 9 images]
[im 4/32  brain]
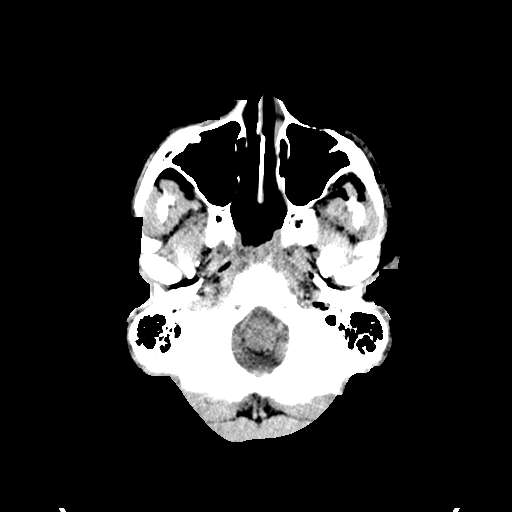
[im 4/32  bone]
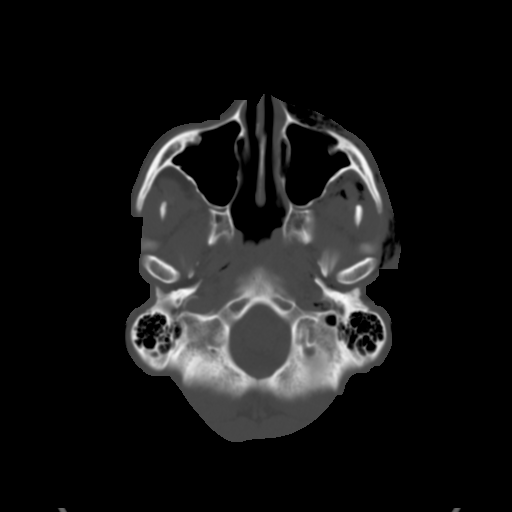
[im 8/32  brain]
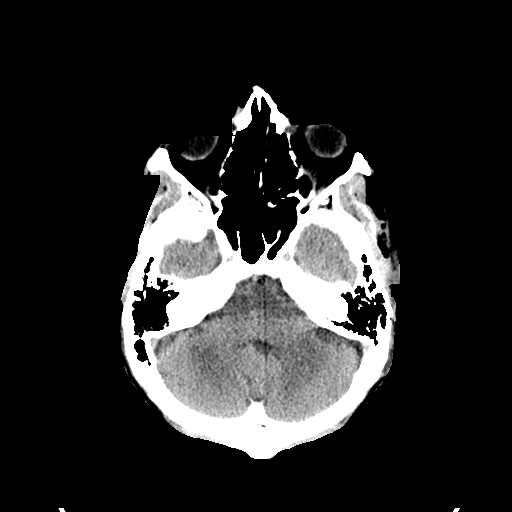
[im 12/32  brain]
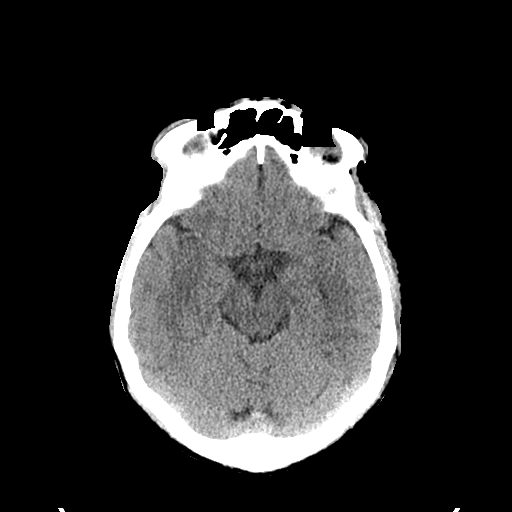
[im 16/32  brain]
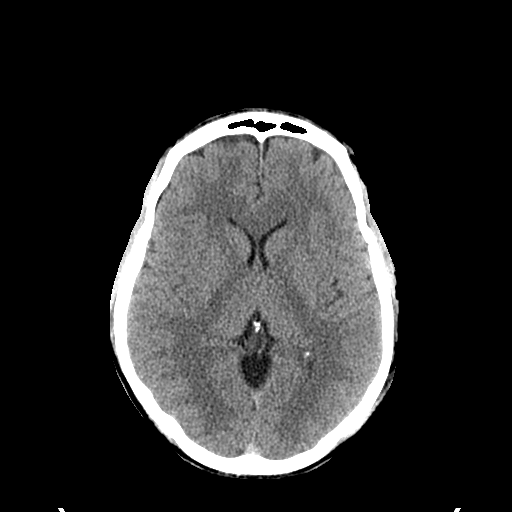
[im 20/32  brain]
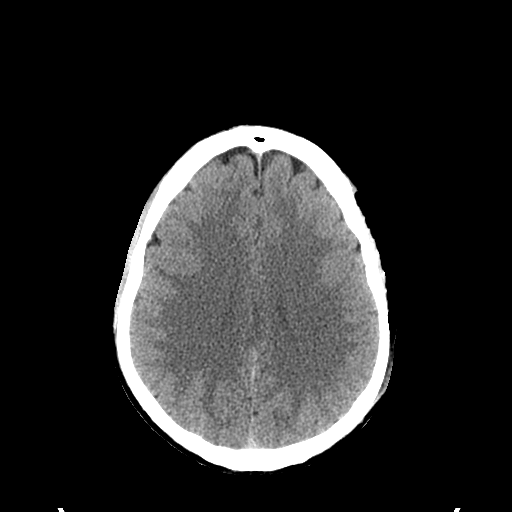
[im 20/32  bone]
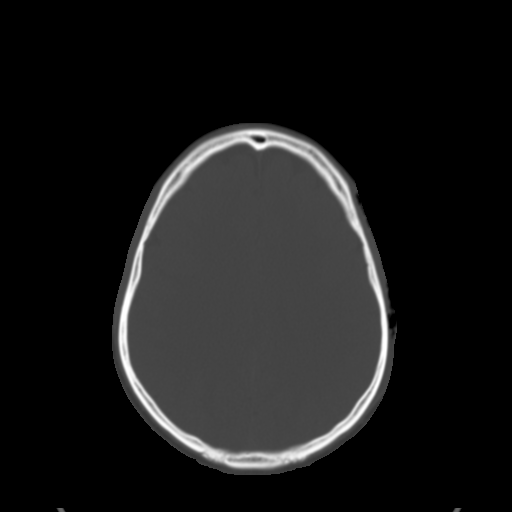
[im 24/32  brain]
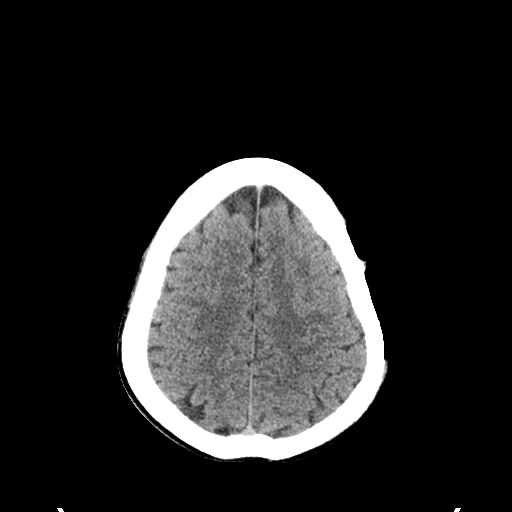
[im 28/32  brain]
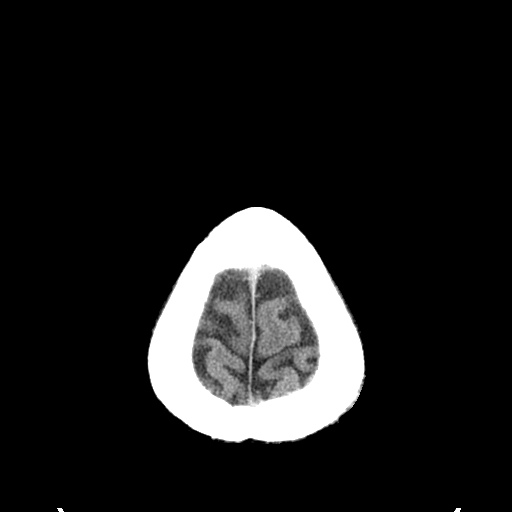

[Series 3: head bone · axial · 0.52mm/px · z∈[-97,-81]mm · 2 of 79 slices shown]
[im 8/79  bone]
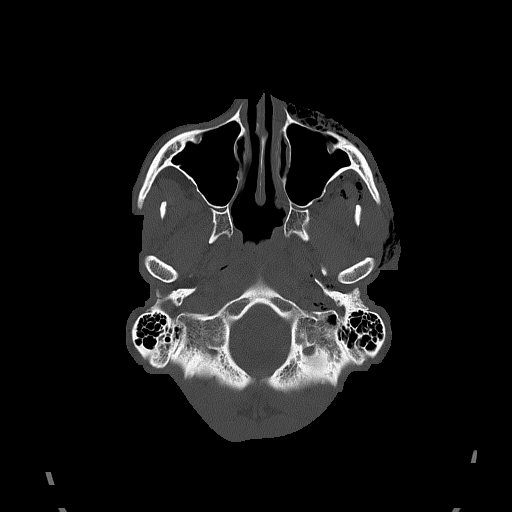
[im 16/79  bone]
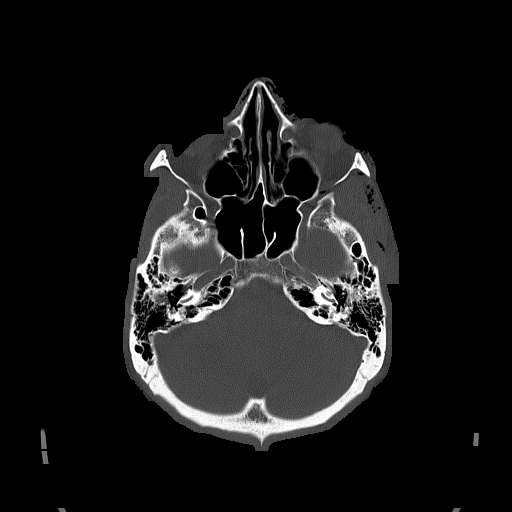

[Series 4: coronal soft tissue · coronal · 0.30mm/px · 3 of 68 slices shown]
[im 23/68  brain]
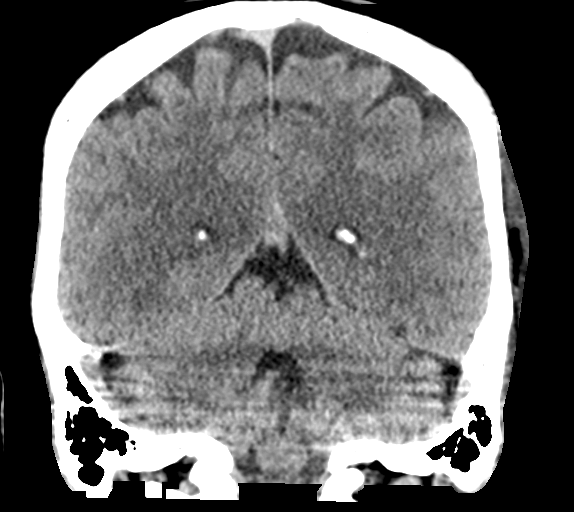
[im 30/68  brain]
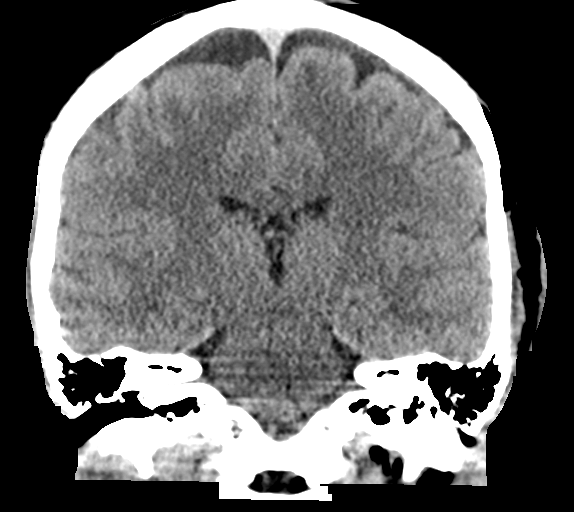
[im 38/68  brain]
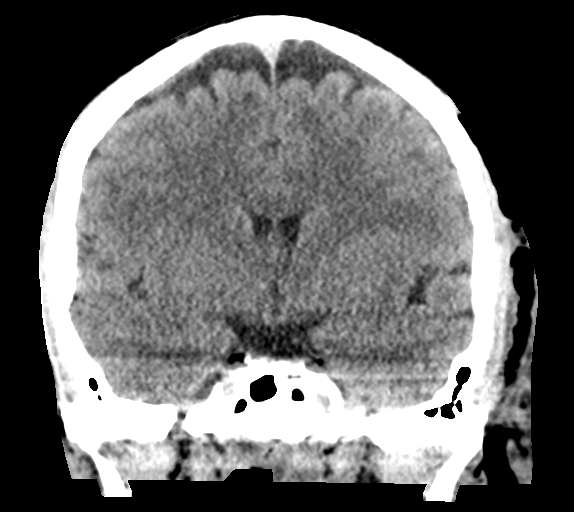

[Series 5: sagittal soft tissue · sagittal · 0.30mm/px · 3 of 59 slices shown]
[im 20/59  brain]
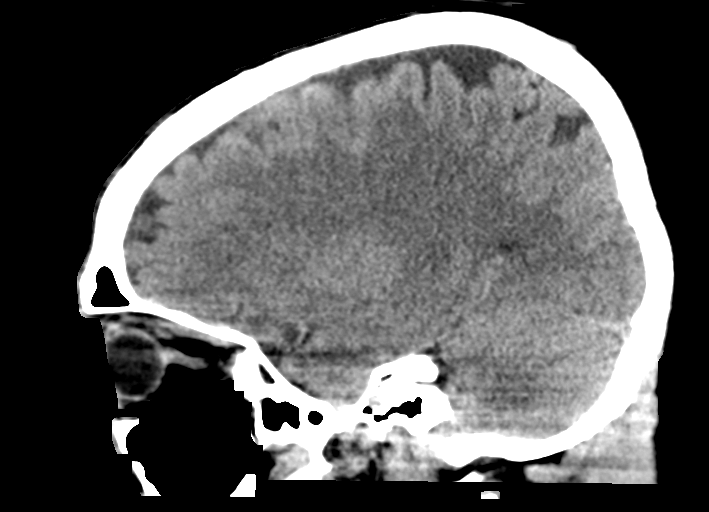
[im 30/59  brain]
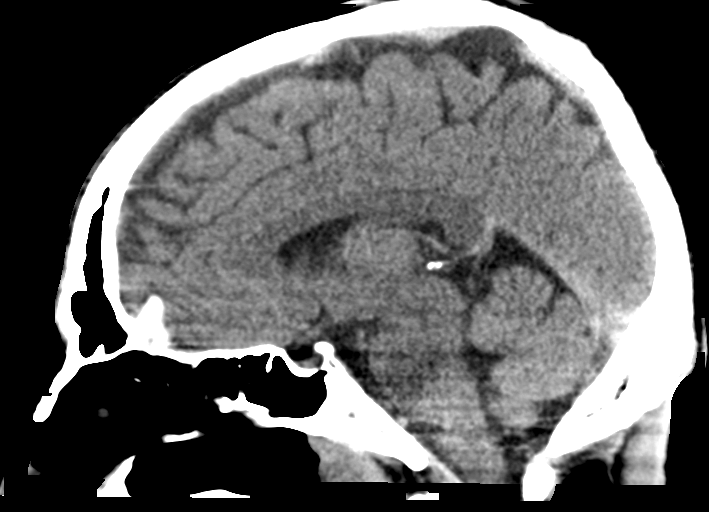
[im 39/59  brain]
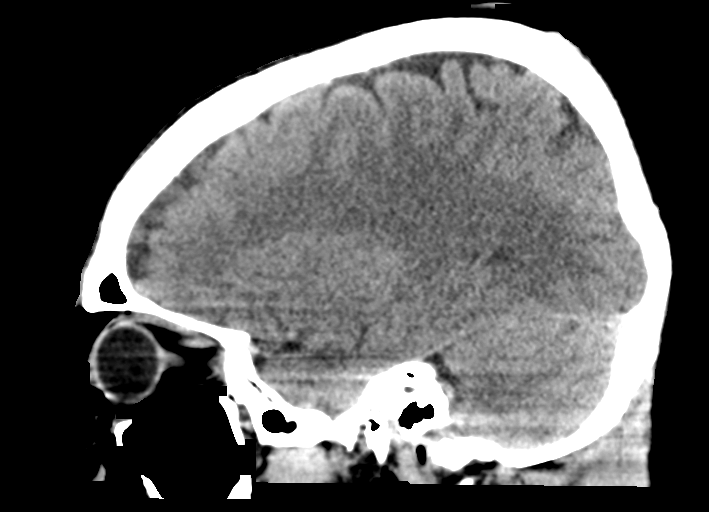

[15 of 47 positions shown; findings below may reference images not displayed]

FINDINGS: Brain: No acute infarct or hemorrhage. Lateral ventricles and
midline structures are unremarkable. No acute extra-axial fluid
collection. No mass effect.

Vascular: No hyperdense vessel or unexpected calcification.

Skull: Normal. Negative for fracture or focal lesion.

Sinuses/Orbits: No acute finding.

Other: Extensive subcutaneous emphysema throughout the left side of
the face, as well as a left periorbital soft tissues and left
frontal scalp. A small amount of gas is seen within the left orbital
apex. Soft tissue gas is also seen throughout the left
parapharyngeal soft tissues. Findings are consistent with
gas-forming infection. No evidence of fluid collection or abscess.
IMPRESSION: 1. Subcutaneous emphysema throughout the left-sided facial soft
tissues as above, consistent with gas-forming infection and
necrotizing fasciitis. No fluid collection or abscess.
2. Otherwise no acute intracranial process.

## 2023-11-22 ENCOUNTER — Ambulatory Visit
Admission: EM | Admit: 2023-11-22 | Discharge: 2023-11-22 | Disposition: A | Discharge status: Home / Self Care | Payer: Self-pay | Attending: Physician Assistant | Admitting: Physician Assistant

## 2023-11-22 ENCOUNTER — Encounter: Payer: Self-pay | Admitting: Emergency Medicine

## 2023-11-22 DIAGNOSIS — S5012XA Contusion of left forearm, initial encounter: Secondary | ICD-10-CM

## 2023-11-22 DIAGNOSIS — L03114 Cellulitis of left upper limb: Secondary | ICD-10-CM

## 2023-11-22 MED ORDER — CEPHALEXIN 500 MG PO CAPS: 500.0000 mg | ORAL_CAPSULE | Freq: Four times a day (QID) | ORAL | 0 refills | Status: AC

## 2023-11-22 NOTE — ED Provider Notes (Signed)
[facility]    CSN: [id number] Arrival date & time: 11/22/23  1304      History   Chief Complaint Chief Complaint  Patient presents with   Insect Bite    Left forearm    HPI Zachary Pierce is a 37 y.o. male presenting for an area of redness and swelling to the left ventral forearm that began yesterday.  He says it was red and raised previously.  It is flatter now but he has noted a lot of bruising around the area.  He thinks he was bitten by something.  He denies known injury.  He does take Eliquis.  Has a history of sepsis related to infection of mouth as well as necrotizing fasciitis.  He is concerned about a possible infection.  He denies fever and has not noticed any drainage from the area.  No home treatments attempted.  HPI  Past Medical History:  Diagnosis Date   Hypertension     There are no problems to display for this patient.   History reviewed. No pertinent surgical history.     Home Medications    Prior to Admission medications   Medication Sig Start Date End Date Taking? Authorizing Provider  cephALEXin (KEFLEX) 500 MG capsule Take 1 capsule (500 mg total) by mouth 4 (four) times daily for 7 days. 11/22/23 11/29/23 Yes Eusebio Friendly B, PA-C  amLODipine (NORVASC) 10 MG tablet Take by mouth. 02/23/22   [provider]  apixaban (ELIQUIS) 5 MG TABS tablet Take by mouth. 02/22/22   [provider]  carvedilol (COREG) 25 MG tablet Take by mouth. 02/22/22   [provider]    Family History History reviewed. No pertinent family history.  Social History Social History   Tobacco Use   Smoking status: Every Day    Types: Cigars   Smokeless tobacco: Never  Vaping Use   Vaping status: Never Used  Substance Use Topics   Alcohol use: Yes   Drug use: Yes    Types: Marijuana     Allergies   Patient has no known allergies.   Review of Systems Review of Systems  Constitutional:  Negative for fatigue and fever.   Respiratory:  Negative for shortness of breath.   Cardiovascular:  Negative for chest pain.  Musculoskeletal:  Negative for arthralgias and joint swelling.  Skin:  Positive for color change. Negative for rash.  Neurological:  Negative for dizziness, weakness, numbness and headaches.     Physical Exam Triage Vital Signs ED Triage Vitals  Encounter Vitals Group     BP      Systolic BP Percentile      Diastolic BP Percentile      Pulse      Resp      Temp      Temp src      SpO2      Weight      Height      Head Circumference      Peak Flow      Pain Score      Pain Loc      Pain Education      Exclude from Growth Chart    No data found.  Updated Vital Signs BP (S) (!) 185/118 (BP Location: Left Arm)   Pulse 100   Temp 98.6 F (37 C) (Oral)   Resp 15   Ht 5\' 9"  (1.753 m)   Wt 156 lb 1.4 oz (70.8 kg)   SpO2 98%  BMI 23.05 kg/m      Physical Exam Vitals and nursing note reviewed.  Constitutional:      General: He is not in acute distress.    Appearance: Normal appearance. He is well-developed. He is not ill-appearing.  HENT:     Head: Normocephalic and atraumatic.  Eyes:     General: No scleral icterus.    Conjunctiva/sclera: Conjunctivae normal.  Cardiovascular:     Rate and Rhythm: Normal rate and regular rhythm.     Heart sounds: Normal heart sounds.  Pulmonary:     Effort: Pulmonary effort is normal. No respiratory distress.     Breath sounds: Normal breath sounds.  Musculoskeletal:     Cervical back: Neck supple.  Skin:    General: Skin is warm and dry.     Capillary Refill: Capillary refill takes less than 2 seconds.     Comments: LEFT FOREARM: There is an area of erythema ~2 cm x 2 cm with small ulceration and surrounding purple contusion of ventral forearm. No tenderness. No bleeding or drainage.   Neurological:     General: No focal deficit present.     Mental Status: He is alert. Mental status is at baseline.     Motor: No weakness.   Psychiatric:        Mood and Affect: Mood normal.        Behavior: Behavior normal.      UC Treatments / Results  Labs (all labs ordered are listed, but only abnormal results are displayed) Labs Reviewed - No data to display  EKG   Radiology No results found.  Procedures Procedures (including critical care time)  Medications Ordered in UC Medications - No data to display  Initial Impression / Assessment and Plan / UC Course  I have reviewed the triage vital signs and the nursing notes.  Pertinent labs & imaging results that were available during my care of the patient were reviewed by me and considered in my medical decision making (see chart for details).   37 year old male presents for redness and painless bruising of the left ventral forearm.  Denies known injury.  On exam he has a large contusion with central erythema without swelling and no tenderness.  This consistent with contusion and possible early cellulitis.  He has a significant medical history for sepsis and necrotizing fasciitis and fears bacterial infection.  Will go ahead and cover him at this time for bacterial process with Keflex.  Also advised cryotherapy, rest, fluids, wound care guidelines discussed.  Reviewed return and ER precautions.   Final Clinical Impressions(s) / UC Diagnoses   Final diagnoses:  Cellulitis of left forearm  Contusion of left forearm, initial encounter     Discharge Instructions      -Clear area with soap and water daily and ice area frequently -Begin antibiotics -For fever or acute worsening of symptoms, please go to the ER.     ED Prescriptions     Medication Sig Dispense Auth. Provider   cephALEXin (KEFLEX) 500 MG capsule Take 1 capsule (500 mg total) by mouth 4 (four) times daily for 7 days. 28 capsule Shirlee Latch, PA-C      PDMP not reviewed this encounter.   Shirlee Latch, PA-C 11/22/23 1349

## 2023-11-22 NOTE — ED Triage Notes (Signed)
Patient reports possible insect bite to his left forearm.  Patient reports redness and swelling that was getting worse yesterday.  Patient denies any drainage from the site.  Patient denies fevers.

## 2023-11-22 NOTE — Discharge Instructions (Addendum)
-  Clear area with soap and water daily and ice area frequently -Begin antibiotics -For fever or acute worsening of symptoms, please go to the ER.

## 2024-03-04 ENCOUNTER — Ambulatory Visit
Admission: EM | Admit: 2024-03-04 | Discharge: 2024-03-04 | Disposition: A | Discharge status: Home / Self Care | Payer: Self-pay | Attending: Physician Assistant | Admitting: Physician Assistant

## 2024-03-04 DIAGNOSIS — K649 Unspecified hemorrhoids: Secondary | ICD-10-CM

## 2024-03-04 DIAGNOSIS — I10 Essential (primary) hypertension: Secondary | ICD-10-CM

## 2024-03-04 MED ORDER — AMLODIPINE BESYLATE 10 MG PO TABS: 10.0000 mg | ORAL_TABLET | Freq: Every day | ORAL | 2 refills | Status: AC

## 2024-03-04 MED ORDER — CARVEDILOL 25 MG PO TABS: 25.0000 mg | ORAL_TABLET | Freq: Every day | ORAL | 2 refills | Status: DC

## 2024-03-04 MED ORDER — CARVEDILOL 25 MG PO TABS: 25.0000 mg | ORAL_TABLET | Freq: Every day | ORAL | 1 refills | Status: AC

## 2024-03-04 MED ORDER — HYDROCORTISONE ACETATE 25 MG RE SUPP: 25.0000 mg | Freq: Two times a day (BID) | RECTAL | 0 refills | Status: AC

## 2024-03-04 NOTE — ED Provider Notes (Signed)
 [facility]    CSN: [id number] Arrival date & time: 03/04/24  1617      History   Chief Complaint Chief Complaint  Patient presents with   Hemorrhoids    HPI Zachary Pierce is a 38 y.o. male presenting for concerns about an internal hemorrhoid which she has had intermittently over the past couple of years.  He states this morning he noticed it came back.  He was able to "push it back in."  Denies any rectal or anal pain, swelling or bleeding.  No abdominal pain.  History of constipation issues.  States he used to take stool softeners but has not in a while.  Patient's medical history is negative for hypertension.  Was previously on carvedilol and amlodipine but stopped taking his medications due to not being able to afford them without insurance.  He was also previously on Eliquis was not taken in over a year.  He is not sure if he still supposed to be on it.  He is a current every day smoker.  HPI  Past Medical History:  Diagnosis Date   Hypertension     There are no active problems to display for this patient.   History reviewed. No pertinent surgical history.     Home Medications    Prior to Admission medications   Medication Sig Start Date End Date Taking? Authorizing Provider  amLODipine (NORVASC) 10 MG tablet Take 1 tablet (10 mg total) by mouth daily. 03/04/24  Yes Shirlee Latch, PA-C  hydrocortisone (ANUSOL-HC) 25 MG suppository Place 1 suppository (25 mg total) rectally 2 (two) times daily. 03/04/24  Yes Eusebio Friendly B, PA-C  amLODipine (NORVASC) 10 MG tablet Take by mouth. 02/23/22   [provider]  apixaban (ELIQUIS) 5 MG TABS tablet Take by mouth. 02/22/22   [provider]  carvedilol (COREG) 25 MG tablet Take by mouth. 02/22/22   [provider]  carvedilol (COREG) 25 MG tablet Take 1 tablet (25 mg total) by mouth daily. 03/04/24   Shirlee Latch, PA-C    Family History History reviewed. No pertinent family  history.  Social History Social History   Tobacco Use   Smoking status: Every Day    Types: Cigars   Smokeless tobacco: Never  Vaping Use   Vaping status: Never Used  Substance Use Topics   Alcohol use: Yes   Drug use: Yes    Types: Marijuana     Allergies   Patient has no known allergies.   Review of Systems Review of Systems  Respiratory:  Negative for shortness of breath.   Cardiovascular:  Negative for chest pain.  Gastrointestinal:  Positive for constipation. Negative for abdominal pain, anal bleeding, blood in stool, diarrhea, nausea, rectal pain and vomiting.       +hemorrhoids  Neurological:  Negative for dizziness, weakness and headaches.     Physical Exam Triage Vital Signs ED Triage Vitals [03/04/24 1628]  Encounter Vitals Group     BP (!) 163/121     Systolic BP Percentile      Diastolic BP Percentile      Pulse Rate 96     Resp 15     Temp 98.3 F (36.8 C)     Temp Source Oral     SpO2 96 %     Weight      Height      Head Circumference      Peak Flow      Pain Score 0  Pain Loc      Pain Education      Exclude from Growth Chart    No data found.  Updated Vital Signs BP (!) 163/121 (BP Location: Right Arm) Comment: patient states that he is aware of hypertension. Patients states that he cant afford medication.  Pulse 96   Temp 98.3 F (36.8 C) (Oral)   Resp 15   SpO2 96%      Physical Exam Vitals and nursing note reviewed.  Constitutional:      General: He is not in acute distress.    Appearance: Normal appearance. He is well-developed. He is not ill-appearing.  HENT:     Head: Normocephalic and atraumatic.  Eyes:     General: No scleral icterus.    Conjunctiva/sclera: Conjunctivae normal.  Cardiovascular:     Rate and Rhythm: Normal rate and regular rhythm.  Pulmonary:     Effort: Pulmonary effort is normal. No respiratory distress.     Breath sounds: Normal breath sounds.  Abdominal:     Palpations: Abdomen is soft.      Tenderness: There is no abdominal tenderness.  Genitourinary:    Comments: Patient declines DRE Musculoskeletal:     Cervical back: Neck supple.  Skin:    General: Skin is warm and dry.     Capillary Refill: Capillary refill takes less than 2 seconds.  Neurological:     General: No focal deficit present.     Mental Status: He is alert. Mental status is at baseline.     Motor: No weakness.     Gait: Gait normal.  Psychiatric:        Mood and Affect: Mood normal.        Behavior: Behavior normal.      UC Treatments / Results  Labs (all labs ordered are listed, but only abnormal results are displayed) Labs Reviewed - No data to display  EKG   Radiology No results found.  Procedures Procedures (including critical care time)  Medications Ordered in UC Medications - No data to display  Initial Impression / Assessment and Plan / UC Course  I have reviewed the triage vital signs and the nursing notes.  Pertinent labs & imaging results that were available during my care of the patient were reviewed by me and considered in my medical decision making (see chart for details).   38 year old male with history of hypertension presents for internal hemorrhoid which prolapse.  Patient was able to reduce it.  Denies bleeding or pain.  History of constipation.  Has not recently been taking any stool softeners.  Also has history of hypertension but has not been on carvedilol and amlodipine in nearly a year.  Blood pressure 163/121.  Patient declines rectal exam.  States he push the hemorrhoid back and there is nothing to see now.  He requests a cream.  States he has tried hydrocortisone cream without relief.  Sent Anusol to pharmacy.  Advised stool softeners with senna, rest and fluids.  Refilled carvedilol and amlodipine.  Coupon given.  Advised following up with PCP for additional refills of blood pressure medication and keep a log of blood pressure.  Reviewed return  precautions.   Final Clinical Impressions(s) / UC Diagnoses   Final diagnoses:  Hemorrhoids, unspecified hemorrhoid type  Essential hypertension     Discharge Instructions      -I printed prescription for Anusol suppository.  I asked the pharmacy staff for help with coupon. - Consider use of senna over-the-counter stool  softener.  Increase rest and fluids. - If you have pain or severe bleeding please go to the ER. -I printed refills for your blood pressure medications with coupons.  Please follow-up with a primary care provider.   ED Prescriptions     Medication Sig Dispense Auth. Provider   hydrocortisone (ANUSOL-HC) 25 MG suppository Place 1 suppository (25 mg total) rectally 2 (two) times daily. 12 suppository Eusebio Friendly B, PA-C   amLODipine (NORVASC) 10 MG tablet Take 1 tablet (10 mg total) by mouth daily. 30 tablet Eusebio Friendly B, PA-C   carvedilol (COREG) 25 MG tablet  (Status: Discontinued) Take 1 tablet (25 mg total) by mouth daily. 30 tablet Eusebio Friendly B, PA-C   carvedilol (COREG) 25 MG tablet Take 1 tablet (25 mg total) by mouth daily. 60 tablet Gareth Morgan      PDMP not reviewed this encounter.   Shirlee Latch, PA-C 03/04/24 1712

## 2024-03-04 NOTE — ED Triage Notes (Signed)
 Pt states that he needs cream for hemorrhoid  Patient states that he pushed Hemorid back in this morning.

## 2024-03-04 NOTE — Discharge Instructions (Signed)
-  I printed prescription for Anusol suppository.  I asked the pharmacy staff for help with coupon. - Consider use of senna over-the-counter stool softener.  Increase rest and fluids. - If you have pain or severe bleeding please go to the ER. -I printed refills for your blood pressure medications with coupons.  Please follow-up with a primary care provider.
